# Patient Record
Sex: Male | Born: 1985 | Race: Black or African American | Marital: Married | Smoking: Former smoker
Health system: Southern US, Community
[De-identification: ages and names within clinical notes are randomized; demographics above are authoritative.]

## PROBLEM LIST (undated history)

## (undated) DIAGNOSIS — L709 Acne, unspecified: Secondary | ICD-10-CM

## (undated) HISTORY — DX: Acne, unspecified: L70.9

---

## 2011-08-16 ENCOUNTER — Ambulatory Visit (INDEPENDENT_AMBULATORY_CARE_PROVIDER_SITE_OTHER): Payer: 59 | Admitting: Family Medicine

## 2011-08-16 ENCOUNTER — Encounter: Payer: Self-pay | Admitting: Family Medicine

## 2011-08-16 VITALS — BP 122/74 | HR 72 | Ht 73.0 in | Wt 180.0 lb

## 2011-08-16 DIAGNOSIS — Z0000 Encounter for general adult medical examination without abnormal findings: Secondary | ICD-10-CM

## 2011-08-16 DIAGNOSIS — L708 Other acne: Secondary | ICD-10-CM

## 2011-08-16 DIAGNOSIS — L700 Acne vulgaris: Secondary | ICD-10-CM

## 2011-08-16 DIAGNOSIS — Z13220 Encounter for screening for lipoid disorders: Secondary | ICD-10-CM

## 2011-08-16 DIAGNOSIS — Z23 Encounter for immunization: Secondary | ICD-10-CM

## 2011-08-16 LAB — POCT URINALYSIS DIPSTICK
Blood, UA: NEGATIVE
Ketones, UA: NEGATIVE
Protein, UA: NEGATIVE
Spec Grav, UA: 1.015
Urobilinogen, UA: NEGATIVE

## 2011-08-16 NOTE — Progress Notes (Signed)
Scott Arroyo is a 26 y.o. male who presents for a complete physical.  He has the following concerns:  Trying to have a child for 5 months, and hasn't been successful.  His partner has a child from previous relationship.  He has no other children, but has always used condoms to prevent pregnancies in the past.   Health Maintenance: There is no immunization history on file for this patient. Last tetanus shot >10 years ago. Doesn't get flu shots. Last colonoscopy: never, n/a Last PSA: n/a Dentist: appt today (past due) Ophtho: last week, glasses ordered Exercise: 3-4 times/week, weights.  Plays basketball.  History reviewed. No pertinent past medical history.  History reviewed. No pertinent past surgical history.  History   Social History  . Marital Status: Single    Spouse Name: N/A    Number of Children: N/A  . Years of Education: N/A   Occupational History  . 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 Smoking status: Current Some Day Smoker -- .5 years    Types: Cigars  . Smokeless tobacco: Not on file   Comment: smokes 2 cigarettes/week  . Alcohol Use: Not on file     0-1 drinks per week.  . Drug Use: No  . Sexually Active: Not on file   Other Topics Concern  . Not on file   Social History Narrative   Engaged to be married.  Lives with fiancee and her daughter, 2 dogs    Family History  Problem Relation Age of Onset  . Diabetes Paternal Uncle   . Cancer Neg Hx   . Heart disease Neg Hx   . Stroke Neg Hx     No current outpatient prescriptions on file.  No Known Allergies  ROS:  The patient denies anorexia, fever, weight changes, headaches,  vision loss, decreased hearing, ear pain, hoarseness, chest pain, palpitations, dizziness, syncope, dyspnea on exertion, cough, swelling, nausea, vomiting, diarrhea, constipation, abdominal pain, melena, hematochezia, indigestion/heartburn, hematuria, incontinence, erectile dysfunction, nocturia,  weakened urine stream, dysuria, genital lesions, numbness, tingling, weakness, tremor, suspicious skin lesions, depression, anxiety, abnormal bleeding/bruising, or enlarged lymph nodes  +L knee pain--injured in past with basketball.  Recently flaring since he started exercising again.  PHYSICAL EXAM: BP 122/74  Pulse 72  Ht 6\' 1"  (1.854 m)  Wt 180 lb (81.647 kg)  BMI 23.75 kg/m2  General Appearance:    Alert, cooperative, no distress, appears stated age  Head:    Normocephalic, without obvious abnormality, atraumatic  Eyes:    PERRL, conjunctiva/corneas clear, EOM's intact, fundi    benign  Ears:    Normal TM's and external ear canals  Nose:   Nares normal, mucosa normal, no drainage or sinus   tenderness  Throat:   Lips, mucosa, and tongue normal; teeth and gums normal  Neck:   Supple, no lymphadenopathy;  thyroid:  no   enlargement/tenderness/nodules; no carotid   bruit or JVD  Back:    Spine nontender, no curvature, ROM normal, no CVA     tenderness  Lungs:     Clear to auscultation bilaterally without wheezes, rales or     ronchi; respirations unlabored  Chest Wall:    No tenderness or deformity   Heart:    Regular rate and rhythm, S1 and S2 normal, no murmur, rub   or gallop  Breast Exam:    No chest wall tenderness, masses or gynecomastia  Abdomen:     Soft, non-tender, nondistended,  normoactive bowel sounds,    no masses, no hepatosplenomegaly  Genitalia:    Normal male external genitalia without lesions.  Testicles without masses.  No inguinal hernias.  Rectal:   Deferred due to age <40 and lack of symptoms  Extremities:   No clubbing, cyanosis or edema. Left knee with slight effusion, warmth.  FROM  Pulses:   2+ and symmetric all extremities  Skin:   Skin color, texture, turgor normal, no rashes or lesions. Some pustular acne on forehead  Lymph nodes:   Cervical, supraclavicular, and axillary nodes normal  Neurologic:   CNII-XII intact, normal strength, sensation and gait;  reflexes 2+ and symmetric throughout          Psych:   Normal mood, affect, hygiene and grooming.    ASSESSMENT/PLAN: 1. Routine general medical examination at a health care facility  POCT Urinalysis Dipstick, HIV Antibody  2. Need for Tdap vaccination  Tdap vaccine greater than or equal to 7yo IM  3. Screening for lipoid disorders  Lipid panel  4. Acne vulgaris     L knee pain--some effusion.  Trial of aleve or ibuprofen for a week, icing.  F/u with ortho if ongoing pain. Acne--OTC trial of benzoyl peroxide or salicylic acid  Doesn't truly meet criteria for infertility yet--has only been 5 months.  Discussed having partner determine whether or not she is ovulating (basal body temps, or ovulation predictor kit).  If ongoing difficulties with conceiving, will at some point need evaluation of sperm for count and motility, but still early.  Reassured  Recommended at least 30 minutes of aerobic activity at least 5 days/week; proper sunscreen use reviewed; healthy diet and alcohol recommendations (less than or equal to 2 drinks/day) reviewed; regular seatbelt use; changing batteries in smoke detectors. Self-testicular exams. Immunization recommendations discussed--Tdap given.  Colonoscopy recommendations reviewed--age 80

## 2011-08-16 NOTE — Patient Instructions (Addendum)
HEALTH MAINTENANCE RECOMMENDATIONS:  It is recommended that you get at least 30 minutes of aerobic exercise at least 5 days/week (for weight loss, you may need as much as 60-90 minutes). This can be any activity that gets your heart rate up. This can be divided in 10-15 minute intervals if needed, but try and build up your endurance at least once a week.  Weight bearing exercise is also recommended twice weekly.  Eat a healthy diet with lots of vegetables, fruits and fiber.  "Colorful" foods have a lot of vitamins (ie green vegetables, tomatoes, red peppers, etc).  Limit sweet tea, regular sodas and alcoholic beverages, all of which has a lot of calories and sugar.  Up to 2 alcoholic drinks daily may be beneficial for men (unless trying to lose weight, watch sugars).  Drink a lot of water.  Sunscreen of at least SPF 30 should be used on all sun-exposed parts of the skin when outside between the hours of 10 am and 4 pm (not just when at beach or pool, but even with exercise, golf, tennis, and yard work!)  Use a sunscreen that says "broad spectrum" so it covers both UVA and UVB rays, and make sure to reapply every 1-2 hours.  Remember to change the batteries in your smoke detectors when changing your clock times in the spring and fall.  Use your seat belt every time you are in a car, and please drive safely and not be distracted with cell phones and texting while driving.  Self testicular exams once a month.  I recommend that your partner be checking to see if she is ovulating regularly (ie basal body temperatures, or ovulation predictor kits).  If she is ovulating regularly, and you are timing your intercourse correctly with ovulation, and you have been unable to get pregnant after a year of trying, then you both can consider consulting a fertility doctor (I would start with her regular OB-GYN), who will definitely recommend collecting sperm samples at that time.    Left knee pain--ice it when  swollen, and after exercise. Try taking Aleve twice daily for a week, and see if pain/swelling improves (OR ibuprofen, 3 tablets 3-4 times daily).  Take meds with food.  Over-the-counter acne medications containing either salicylic acid or benzoyl peroxide may help with acne.  Consider trying ProActive.

## 2011-08-17 ENCOUNTER — Other Ambulatory Visit: Payer: 59

## 2011-08-17 DIAGNOSIS — Z0000 Encounter for general adult medical examination without abnormal findings: Secondary | ICD-10-CM

## 2011-08-17 DIAGNOSIS — Z13220 Encounter for screening for lipoid disorders: Secondary | ICD-10-CM

## 2011-08-17 LAB — LIPID PANEL
Cholesterol: 162 mg/dL (ref 0–200)
LDL Cholesterol: 104 mg/dL — ABNORMAL HIGH (ref 0–99)
Total CHOL/HDL Ratio: 3.2 Ratio
VLDL: 8 mg/dL (ref 0–40)

## 2011-08-18 ENCOUNTER — Encounter: Payer: Self-pay | Admitting: Family Medicine

## 2013-09-18 ENCOUNTER — Encounter: Payer: Self-pay | Admitting: Family Medicine

## 2013-09-18 ENCOUNTER — Ambulatory Visit (INDEPENDENT_AMBULATORY_CARE_PROVIDER_SITE_OTHER): Payer: BC Managed Care – HMO | Admitting: Family Medicine

## 2013-09-18 VITALS — BP 118/72 | HR 72 | Ht 74.0 in | Wt 172.0 lb

## 2013-09-18 DIAGNOSIS — M25562 Pain in left knee: Principal | ICD-10-CM

## 2013-09-18 DIAGNOSIS — M25569 Pain in unspecified knee: Secondary | ICD-10-CM

## 2013-09-18 DIAGNOSIS — M25561 Pain in right knee: Secondary | ICD-10-CM

## 2013-09-18 NOTE — Progress Notes (Signed)
Chief Complaint  Patient presents with  . Knee Pain    B/L knee pain, left worse than right x 5 months. Plays basketball a lot and exercises. After tournament 08/23/13 lefy knee was swollen and really painful.     He has had pain off and on in his knees when he plays sport/exercises. It hurts while he is playing basketball, but doesn't persist long after. Knees don't usually swell, doesn't need to take any medication.  However, after a basketball tournament on 3/28, playing 5 games, his left knee was swollen behind the knee.  He took 2 advil every 6-8 hours for about 2-3 days, then pain /swelling resolved.  He presents today to have knee pain evaluated, since it hurts every time he plays basketball, or working out with his wife (jumping rope, jogging, squats). He admits to only stretching his quads, no other stretching.   History reviewed. No pertinent past medical history. History reviewed. No pertinent past surgical history. History   Social History  . Marital Status: Married    Spouse Name: N/A    Number of Children: N/A  . Years of Education: N/A   Occupational History  . machine operator    Social History Main Topics  . Smoking status: Current Some Day Smoker -- .5 years    Types: Cigars  . Smokeless tobacco: Not on file     Comment: vapor only  . Alcohol Use: Yes     Comment: 0-1 drinks per week.  . Drug Use: No  . Sexual Activity: Not on file   Other Topics Concern  . Not on file   Social History Narrative   Married, lives with wife and her daughter, 2 dogs   Family History  Problem Relation Age of Onset  . Diabetes Paternal Uncle   . Cancer Neg Hx   . Heart disease Neg Hx   . Stroke Neg Hx    No current outpatient prescriptions on file prior to visit.   No current facility-administered medications on file prior to visit.   No Known Allergies  ROS:  Denies fevers, weight changes, URI symptoms, headaches, dizziness, chest pain, GI or GU complaints, bleeding,  bruising, rash, other joint pains or other concerns.  PHYSICAL EXAM: BP 118/72  Pulse 72  Ht 6\' 2"  (1.88 m)  Wt 172 lb (78.019 kg)  BMI 22.07 kg/m2  Well developed, pleasant male in no distress Knees:  No effusion, warmth, swelling or crepitus. No joint line tenderness, or any tenderness.  FROM.  Negative Lachman, McMurray, flick test, drawer.  No pain with varus/valgus stress.  No pain with patellar compression, no patellar maltracking.  Good VMO muscle. Hamstrings are a little tight, unable to touch toes.  ASSESSMENT/PLAN:  Knee pain, bilateral  No evidence of internal derangement, arthritis, tendonitis, bursitis, or other abnormality--completely normal exam.  Taught proper stretching.  Ice prn if pain after activity, NSAID prn for any swelling.  F/u if any locking, giving way, recurrent swelling or other problems.

## 2013-09-18 NOTE — Patient Instructions (Signed)
Please do the stretches as shown after any exercise. Return if you have ongoing pain and swelling, or new problems develop.

## 2014-01-19 ENCOUNTER — Other Ambulatory Visit: Payer: Self-pay | Admitting: Family Medicine

## 2014-01-19 ENCOUNTER — Ambulatory Visit (INDEPENDENT_AMBULATORY_CARE_PROVIDER_SITE_OTHER): Payer: BC Managed Care – HMO | Admitting: Family Medicine

## 2014-01-19 ENCOUNTER — Encounter: Payer: Self-pay | Admitting: Family Medicine

## 2014-01-19 VITALS — BP 118/72 | HR 84 | Ht 73.0 in | Wt 169.0 lb

## 2014-01-19 DIAGNOSIS — R0989 Other specified symptoms and signs involving the circulatory and respiratory systems: Secondary | ICD-10-CM

## 2014-01-19 DIAGNOSIS — R002 Palpitations: Secondary | ICD-10-CM

## 2014-01-19 DIAGNOSIS — Z0000 Encounter for general adult medical examination without abnormal findings: Secondary | ICD-10-CM

## 2014-01-19 DIAGNOSIS — Z23 Encounter for immunization: Secondary | ICD-10-CM

## 2014-01-19 DIAGNOSIS — R0683 Snoring: Secondary | ICD-10-CM

## 2014-01-19 DIAGNOSIS — R0609 Other forms of dyspnea: Secondary | ICD-10-CM

## 2014-01-19 DIAGNOSIS — Z3169 Encounter for other general counseling and advice on procreation: Secondary | ICD-10-CM

## 2014-01-19 LAB — COMPREHENSIVE METABOLIC PANEL
ALBUMIN: 4.8 g/dL (ref 3.5–5.2)
ALT: 69 U/L — AB (ref 0–53)
AST: 73 U/L — AB (ref 0–37)
Alkaline Phosphatase: 51 U/L (ref 39–117)
BUN: 16 mg/dL (ref 6–23)
CALCIUM: 9.7 mg/dL (ref 8.4–10.5)
CHLORIDE: 101 meq/L (ref 96–112)
CO2: 28 mEq/L (ref 19–32)
Creat: 0.99 mg/dL (ref 0.50–1.35)
Glucose, Bld: 83 mg/dL (ref 70–99)
POTASSIUM: 4 meq/L (ref 3.5–5.3)
SODIUM: 137 meq/L (ref 135–145)
TOTAL PROTEIN: 7.4 g/dL (ref 6.0–8.3)
Total Bilirubin: 1.1 mg/dL (ref 0.2–1.2)

## 2014-01-19 LAB — CBC WITH DIFFERENTIAL/PLATELET
BASOS ABS: 0 10*3/uL (ref 0.0–0.1)
BASOS PCT: 0 % (ref 0–1)
EOS ABS: 0.2 10*3/uL (ref 0.0–0.7)
Eosinophils Relative: 3 % (ref 0–5)
HCT: 37.4 % — ABNORMAL LOW (ref 39.0–52.0)
HEMOGLOBIN: 12.7 g/dL — AB (ref 13.0–17.0)
Lymphocytes Relative: 50 % — ABNORMAL HIGH (ref 12–46)
Lymphs Abs: 2.6 10*3/uL (ref 0.7–4.0)
MCH: 27.1 pg (ref 26.0–34.0)
MCHC: 34 g/dL (ref 30.0–36.0)
MCV: 79.9 fL (ref 78.0–100.0)
Monocytes Absolute: 0.5 10*3/uL (ref 0.1–1.0)
Monocytes Relative: 9 % (ref 3–12)
NEUTROS ABS: 1.9 10*3/uL (ref 1.7–7.7)
NEUTROS PCT: 38 % — AB (ref 43–77)
PLATELETS: 294 10*3/uL (ref 150–400)
RBC: 4.68 MIL/uL (ref 4.22–5.81)
RDW: 14.8 % (ref 11.5–15.5)
WBC: 5.1 10*3/uL (ref 4.0–10.5)

## 2014-01-19 LAB — POCT URINALYSIS DIPSTICK
BILIRUBIN UA: NEGATIVE
GLUCOSE UA: NEGATIVE
Ketones, UA: NEGATIVE
Leukocytes, UA: NEGATIVE
NITRITE UA: NEGATIVE
RBC UA: NEGATIVE
Spec Grav, UA: 1.02
Urobilinogen, UA: NEGATIVE
pH, UA: 5

## 2014-01-19 LAB — TSH: TSH: 1.471 u[IU]/mL (ref 0.350–4.500)

## 2014-01-19 NOTE — Patient Instructions (Addendum)
  HEALTH MAINTENANCE RECOMMENDATIONS:  It is recommended that you get at least 30 minutes of aerobic exercise at least 5 days/week (for weight loss, you may need as much as 60-90 minutes). This can be any activity that gets your heart rate up. This can be divided in 10-15 minute intervals if needed, but try and build up your endurance at least once a week.  Weight bearing exercise is also recommended twice weekly.  Eat a healthy diet with lots of vegetables, fruits and fiber.  "Colorful" foods have a lot of vitamins (ie green vegetables, tomatoes, red peppers, etc).  Limit sweet tea, regular sodas and alcoholic beverages, all of which has a lot of calories and sugar.  Up to 2 alcoholic drinks daily may be beneficial for men (unless trying to lose weight, watch sugars).  Drink a lot of water.  Sunscreen of at least SPF 30 should be used on all sun-exposed parts of the skin when outside between the hours of 10 am and 4 pm (not just when at beach or pool, but even with exercise, golf, tennis, and yard work!)  Use a sunscreen that says "broad spectrum" so it covers both UVA and UVB rays, and make sure to reapply every 1-2 hours.  Remember to change the batteries in your smoke detectors when changing your clock times in the spring and fall.  Use your seat belt every time you are in a car, and please drive safely and not be distracted with cell phones and texting while driving.  Please try and not use e-cigarettes.  While these may be safer than cigarettes (and safety is still unknown), there is potential harm.  The nicotine might also be contributing to your heart complaints (beating fast, irregular).  Do self-testicular exams once a month.  Infertility--at this point, I recommend that you and your wife make an appointment with her doctor for evaluation (since it has been >1 year of trying for pregnancy)--this evaluation will include sperm evaluation (to check on number, motility, etc).

## 2014-01-19 NOTE — Progress Notes (Signed)
Chief Complaint  Patient presents with  . Annual Exam    fasting annual exam. UA showed trace protein, no symptoms. No concerns today. Declined flu vaccine today.    Scott Arroyo is a 28 y.o. male who presents for a complete physical.  He has the following concerns:  He was last seen in April with bilateral knee pain.  He has been stretching regularly and taking vitamins, and no longer has any knee problems.  He states that he and his wife are still trying for pregnancy--he would like a son.  He has never fathered a child (per review of chart, over 2 years now).  He is asking about whether marijuana use could effect fertility--he denies any recent use.  Immunization History  Administered Date(s) Administered  . Tdap 08/16/2011   Last colonoscopy: never, n/a  Last PSA: n/a  Dentist: last year, goes once yearly, due Ophtho: every other year Exercise: 3-4 times/week, weights and treadmill for 10 mins at end of weights. Plays basketball 2x/week. Lab Results  Component Value Date   CHOL 162 08/17/2011   HDL 50 08/17/2011   LDLCALC 104* 08/17/2011   TRIG 38 08/17/2011   CHOLHDL 3.2 08/17/2011   History reviewed. No pertinent past medical history.  History reviewed. No pertinent past surgical history.  History   Social History  . Marital Status: Married    Spouse Name: N/A    Number of Children: N/A  . Years of Education: N/A   Occupational History  . machine operator    Social History Main Topics  . Smoking status: Current Some Day Smoker -- .5 years    Types: Cigars  . Smokeless tobacco: Not on file     Comment: vapor only  . Alcohol Use: Yes     Comment: 0-1 drinks per week.  . Drug Use: No  . Sexual Activity: Yes    Partners: Female   Other Topics Concern  . Not on file   Social History Narrative   Married, lives with wife and her daughter, 2 dogs    Family History  Problem Relation Age of Onset  . Diabetes Paternal Uncle   . Cancer Neg Hx   . Heart disease  Neg Hx   . Stroke Neg Hx     Current outpatient prescriptions:Multiple Vitamins-Minerals (MULTI-VITAMIN GUMMIES PO), Take 2 each by mouth daily., Disp: , Rfl:   No Known Allergies  ROS: The patient denies anorexia, fever, weight changes, headaches, vision loss, decreased hearing, ear pain, hoarseness, chest pain, palpitations, dizziness, syncope, dyspnea on exertion, cough, swelling, nausea, vomiting, diarrhea, constipation, abdominal pain, melena, hematochezia, indigestion/heartburn, hematuria, incontinence, erectile dysfunction, nocturia, weakened urine stream, dysuria, genital lesions, numbness, tingling, weakness, tremor, suspicious skin lesions, depression, anxiety, abnormal bleeding/bruising, or enlarged lymph nodes. Sometimes he notes irregular heartbeat, where it periodically speeds up.  Noted on ROS--going on for many years, unchanged.  Occurs at rest, notices some extra beats.  Short-lived, no associated SOB or chest pain. Wife tells him that he snores, doesn't mention apnea.  He feels refreshed in the morning, denies daytime somnolence or morning headaches. Denies joint pains (knee pain resolved) Lost 3# from last visit, down 11 from CPE 2 years ago  PHYSICAL EXAM:  BP 118/72  Pulse 84  Ht  (1.854 m)  Wt 169 lb (76.658 kg)  BMI 22.30 kg/m2   General Appearance:  Alert, cooperative, no distress, appears stated age   Head:  Normocephalic, without obvious abnormality, atraumatic   Eyes:  PERRL, conjunctiva/corneas clear, EOM's intact, fundi  benign   Ears:  Normal TM's and external ear canals   Nose:  Nares normal, mucosa normal, no drainage or sinus tenderness   Throat:  Lips, mucosa, and tongue normal; teeth and gums normal   Neck:  Supple, no lymphadenopathy; thyroid: no enlargement/tenderness/nodules; no carotid  bruit or JVD   Back:  Spine nontender, no curvature, ROM normal, no CVA tenderness   Lungs:  Clear to auscultation bilaterally without wheezes, rales or  ronchi; respirations unlabored   Chest Wall:  No tenderness or deformity   Heart:  Regular rate and rhythm, S1 and S2 normal, no murmur, rub  or gallop   Breast Exam:  No chest wall tenderness, masses or gynecomastia   Abdomen:  Soft, non-tender, nondistended, normoactive bowel sounds,  no masses, no hepatosplenomegaly   Genitalia:  Normal male external genitalia without lesions. Testicles without masses. No inguinal hernias.   Rectal:  Deferred due to age <40 and lack of symptoms   Extremities:  No clubbing, cyanosis or edema. Left knee with slight effusion, warmth. FROM   Pulses:  2+ and symmetric all extremities   Skin:  Skin color, texture, turgor normal, no rashes or lesions. Some pustular acne on forehead. Acne scars (mild) on back, no active lesions.  +tattoos  Lymph nodes:  Cervical, supraclavicular, and axillary nodes normal   Neurologic:  CNII-XII intact, normal strength, sensation and gait; reflexes 2+ and symmetric throughout             Psych: Normal mood, affect, hygiene and grooming.   ASSESSMENT/PLAN:  Routine general medical examination at a health care facility - Plan: POCT Urinalysis Dipstick  Palpitations - Plan: Comprehensive metabolic panel, CBC with Differential, TSH  Need for prophylactic vaccination and inoculation against influenza - Plan: Flu Vaccine QUAD 36+ mos PF IM (Fluarix Quad PF)  Snoring - ask wife re: apnea.  Sounds as though he has mild snoring only when on his back. no symptoms of OSA  Infertility counseling   Palpitations--discussed potential triggers including nicotine (from e-cig) and caffeine. Encouraged to quit use of e-cigs.  Return for re-evaluation if increasing frequency, associated dizziness, chest pain or shortness of breath.  Check labs  Snoring--ask wife re: apnea.  No symptoms to suggest sleep apnea.  Recommended at least 30 minutes of aerobic activity at least 5 days/week; proper sunscreen use reviewed; healthy diet and alcohol  recommendations (less than or equal to 2 drinks/day) reviewed; regular seatbelt use; changing batteries in smoke detectors. Self-testicular exams. Immunization recommendations discussed--flu shot today. Colonoscopy recommendations reviewed--age 71       Infertility--at this point, recommended that wife make appointment with her GYN, and he will have semen eval as part of the workup.  Flu shot given

## 2014-01-21 ENCOUNTER — Other Ambulatory Visit: Payer: Self-pay | Admitting: *Deleted

## 2014-01-21 DIAGNOSIS — R7989 Other specified abnormal findings of blood chemistry: Secondary | ICD-10-CM

## 2014-01-21 DIAGNOSIS — R945 Abnormal results of liver function studies: Secondary | ICD-10-CM

## 2014-01-21 LAB — HEPATITIS C ANTIBODY: HCV Ab: NEGATIVE

## 2014-01-21 LAB — IRON: IRON: 100 ug/dL (ref 42–165)

## 2014-01-21 LAB — FERRITIN: Ferritin: 157 ng/mL (ref 22–322)

## 2014-01-21 LAB — HEPATITIS B SURFACE ANTIGEN: Hepatitis B Surface Ag: NEGATIVE

## 2014-02-23 ENCOUNTER — Other Ambulatory Visit: Payer: BC Managed Care – HMO

## 2014-02-23 DIAGNOSIS — R7989 Other specified abnormal findings of blood chemistry: Secondary | ICD-10-CM

## 2014-02-23 DIAGNOSIS — R945 Abnormal results of liver function studies: Secondary | ICD-10-CM

## 2014-02-23 LAB — HEPATIC FUNCTION PANEL
ALBUMIN: 4.5 g/dL (ref 3.5–5.2)
ALK PHOS: 53 U/L (ref 39–117)
ALT: 12 U/L (ref 0–53)
AST: 17 U/L (ref 0–37)
BILIRUBIN TOTAL: 0.7 mg/dL (ref 0.2–1.2)
Bilirubin, Direct: 0.2 mg/dL (ref 0.0–0.3)
Indirect Bilirubin: 0.5 mg/dL (ref 0.2–1.2)
Total Protein: 6.9 g/dL (ref 6.0–8.3)

## 2014-10-14 ENCOUNTER — Telehealth: Payer: Self-pay | Admitting: Internal Medicine

## 2014-10-14 NOTE — Telephone Encounter (Signed)
Faxed over medical records to [facility] on 10/13/14 @ [phone]

## 2014-12-08 ENCOUNTER — Ambulatory Visit (INDEPENDENT_AMBULATORY_CARE_PROVIDER_SITE_OTHER): Payer: BLUE CROSS/BLUE SHIELD | Admitting: Medical

## 2014-12-08 ENCOUNTER — Encounter: Payer: Self-pay | Admitting: Medical

## 2014-12-08 VITALS — BP 120/70 | HR 69 | Resp 16 | Wt 175.0 lb

## 2014-12-08 DIAGNOSIS — L237 Allergic contact dermatitis due to plants, except food: Secondary | ICD-10-CM | POA: Diagnosis not present

## 2014-12-08 MED ORDER — TRIAMCINOLONE ACETONIDE 0.1 % EX CREA: 1.0000 "application " | TOPICAL_CREAM | Freq: Two times a day (BID) | CUTANEOUS | Status: DC

## 2014-12-08 MED ORDER — TRIAMCINOLONE ACETONIDE 0.1 % EX CREA
1.0000 | TOPICAL_CREAM | Freq: Two times a day (BID) | CUTANEOUS | Status: DC
Start: 2014-12-08 — End: 2015-08-05

## 2014-12-08 MED ORDER — METHYLPREDNISOLONE ACETATE PF 80 MG/ML IJ SUSP
80.0000 mg | Freq: Once | INTRAMUSCULAR | Status: AC
  Administered 2014-12-08: 80 mg via INTRAMUSCULAR

## 2014-12-08 NOTE — Progress Notes (Signed)
Subjective:   Scott Arroyo is a 29 y.o. male who presents for evaluation of a rash involving the arms and abdomen.  Concerned for poison ivy as they have recent exposure - was helping clear some brush for family member over July 4th weekend with short sleeve shirt, otherwise was covered up.  Rash is itchy, red, raised.  Patient denies: fever, joint aches, chills, nausea. Patient has had similar reaction in the past.  Using OTC cream for the rash.  No other aggravating or relieving factors.  No other c/o.   The following portions of the patient's history were reviewed and updated as appropriate: allergies, current medications, past family history, past medical history, past social history and problem list.  Review of Systems As in subjective above   Objective:   Gen: wd, wn, nad Skin:  Several patches of pink/red, linear and round urticarial lesions involving bilat forearms and lower abdomen   Assessment:   Encounter Diagnosis  Name Primary?  . Poison oak dermatitis Yes      Plan:   Discussed symptoms and exam findings, diagnosis, treatment options.  Etiology appears to be poison oak dermatitis.  80 mg Depo-Medrol given IM in office.   Prescribed: Triamcinolone steroid cream to apply topically daily for the next several days until this resolves.  Discussed washing contaminated clothing on the hot cycle in the washing machine, washing gloves, shoes, or other utensils in hot soapy water.  Avoid re- exposure.  Discussed signs of infection or worsening symptoms that would prompt recheck.   Advised OTC Benadryl 1/2-1 tablet po up to TID.    Follow up prn, or if not much improved in the next 4-5 days.

## 2014-12-08 NOTE — Patient Instructions (Signed)
Poison Oak Recommendations:  We gave you a shot of steroid/Depo Medrol today in the office to calm down the rash and irritation  You may use the Triamcinolone cream topically to the arms and abdomen (not the face or genitals though) twice daily for the next week or 2 until resolved  Use OTC Benadryl 25mg  tablet at bedtime and possibly 1/2-1 tablet during the day  If not much improved by Friday, let me know  Poison oak is an inflammation of the skin (contact dermatitis). It is caused by contact with the allergens on the leaves of the oak (toxicodendron) plants. Depending on your sensitivity, the rash may consist simply of redness and itching, or it may also progress to blisters which may break open (rupture). These must be well cared for to prevent secondary germ (bacterial) infection as these infections can lead to scarring. The eyes may also get puffy. The puffiness is worst in the morning and gets better as the day progresses. Healing is best accomplished by keeping any open areas dry, clean, covered with a bandage, and covered with an antibacterial ointment if needed. Without secondary infection, this dermatitis usually heals without scarring within 2 to 3 weeks without treatment. HOME CARE INSTRUCTIONS When you have been exposed to poison oak, it is very important to thoroughly wash with soap and water as soon as the exposure has been discovered. You have about one half hour to remove the plant resin before it will cause the rash. This cleaning will quickly destroy the oil or antigen on the skin (the antigen is what causes the rash). Wash aggressively under the fingernails as any plant resin still there will continue to spread the rash. Do not rub skin vigorously when washing affected area. Poison oak cannot spread if no oil from the plant remains on your body. Rash that has progressed to weeping sores (lesions) will not spread the rash unless you have not washed thoroughly. It is also important to  clean any clothes you have been wearing as they may carry active allergens which will spread the rash, even several days later. Avoidance of the plant in the future is the best measure. Poison oak plants can be recognized by the number of leaves. Generally, poison oak has three leaves with flowering branches on a single stem. Diphenhydramine may be purchased over the counter and used as needed for itching. Do not drive with this medication if it makes you drowsy. Ask your caregiver about medication for children. SEEK IMMEDIATE MEDICAL CARE IF:   Open areas of the rash develop.   You notice redness extending beyond the area of the rash.   There is a pus like discharge.   There is increased pain.   Other signs of infection develop (such as fever).  Document Released: 11/19/2002 Document Revised: 01/25/2011 Document Reviewed: 03/31/2009 [facility] Patient Information 2012 [place], [place].

## 2014-12-08 NOTE — Addendum Note (Signed)
Addended by: Janeice RobinsonSCALES, Jareb Radoncic L on: 12/08/2014 08:55 AM   Modules accepted: Orders

## 2014-12-08 NOTE — Addendum Note (Signed)
Addended by: Herminio CommonsJOHNSON, Latayvia Mandujano A on: 12/08/2014 10:55 AM   Modules accepted: Orders

## 2015-08-05 ENCOUNTER — Ambulatory Visit (INDEPENDENT_AMBULATORY_CARE_PROVIDER_SITE_OTHER): Payer: BLUE CROSS/BLUE SHIELD | Admitting: Family Medicine

## 2015-08-05 ENCOUNTER — Encounter: Payer: Self-pay | Admitting: Family Medicine

## 2015-08-05 VITALS — BP 110/74 | HR 56 | Ht 73.0 in | Wt 171.6 lb

## 2015-08-05 DIAGNOSIS — Z0000 Encounter for general adult medical examination without abnormal findings: Secondary | ICD-10-CM

## 2015-08-05 DIAGNOSIS — R5383 Other fatigue: Secondary | ICD-10-CM | POA: Diagnosis not present

## 2015-08-05 DIAGNOSIS — R945 Abnormal results of liver function studies: Secondary | ICD-10-CM

## 2015-08-05 DIAGNOSIS — Z862 Personal history of diseases of the blood and blood-forming organs and certain disorders involving the immune mechanism: Secondary | ICD-10-CM | POA: Diagnosis not present

## 2015-08-05 DIAGNOSIS — M222X2 Patellofemoral disorders, left knee: Secondary | ICD-10-CM

## 2015-08-05 DIAGNOSIS — R7989 Other specified abnormal findings of blood chemistry: Secondary | ICD-10-CM

## 2015-08-05 LAB — COMPREHENSIVE METABOLIC PANEL
ALT: 21 U/L (ref 9–46)
AST: 20 U/L (ref 10–40)
Albumin: 4.3 g/dL (ref 3.6–5.1)
Alkaline Phosphatase: 42 U/L (ref 40–115)
BILIRUBIN TOTAL: 0.8 mg/dL (ref 0.2–1.2)
BUN: 14 mg/dL (ref 7–25)
CHLORIDE: 104 mmol/L (ref 98–110)
CO2: 26 mmol/L (ref 20–31)
CREATININE: 1.1 mg/dL (ref 0.60–1.35)
Calcium: 9.4 mg/dL (ref 8.6–10.3)
GLUCOSE: 81 mg/dL (ref 65–99)
Potassium: 4.2 mmol/L (ref 3.5–5.3)
SODIUM: 139 mmol/L (ref 135–146)
Total Protein: 7.2 g/dL (ref 6.1–8.1)

## 2015-08-05 LAB — CBC WITH DIFFERENTIAL/PLATELET
BASOS ABS: 0.1 10*3/uL (ref 0.0–0.1)
Basophils Relative: 1 % (ref 0–1)
EOS PCT: 6 % — AB (ref 0–5)
Eosinophils Absolute: 0.3 10*3/uL (ref 0.0–0.7)
HEMATOCRIT: 41.1 % (ref 39.0–52.0)
Hemoglobin: 13.4 g/dL (ref 13.0–17.0)
LYMPHS ABS: 2.3 10*3/uL (ref 0.7–4.0)
LYMPHS PCT: 45 % (ref 12–46)
MCH: 26.9 pg (ref 26.0–34.0)
MCHC: 32.6 g/dL (ref 30.0–36.0)
MCV: 82.5 fL (ref 78.0–100.0)
MONO ABS: 0.4 10*3/uL (ref 0.1–1.0)
MONOS PCT: 8 % (ref 3–12)
MPV: 10.5 fL (ref 8.6–12.4)
Neutro Abs: 2 10*3/uL (ref 1.7–7.7)
Neutrophils Relative %: 40 % — ABNORMAL LOW (ref 43–77)
Platelets: 310 10*3/uL (ref 150–400)
RBC: 4.98 MIL/uL (ref 4.22–5.81)
RDW: 14.2 % (ref 11.5–15.5)
WBC: 5 10*3/uL (ref 4.0–10.5)

## 2015-08-05 LAB — POCT URINALYSIS DIPSTICK
Bilirubin, UA: NEGATIVE
Glucose, UA: NEGATIVE
Ketones, UA: NEGATIVE
Leukocytes, UA: NEGATIVE
NITRITE UA: NEGATIVE
PROTEIN UA: NEGATIVE
RBC UA: NEGATIVE
UROBILINOGEN UA: 0.2
pH, UA: 6

## 2015-08-05 NOTE — Patient Instructions (Addendum)
HEALTH MAINTENANCE RECOMMENDATIONS:  It is recommended that you get at least 30 minutes of aerobic exercise at least 5 days/week (for weight loss, you may need as much as 60-90 minutes). This can be any activity that gets your heart rate up. This can be divided in 10-15 minute intervals if needed, but try and build up your endurance at least once a week.  Weight bearing exercise is also recommended twice weekly.  Eat a healthy diet with lots of vegetables, fruits and fiber.  "Colorful" foods have a lot of vitamins (ie green vegetables, tomatoes, red peppers, etc).  Limit sweet tea, regular sodas and alcoholic beverages, all of which has a lot of calories and sugar.  Up to 2 alcoholic drinks daily may be beneficial for men (unless trying to lose weight, watch sugars).  Drink a lot of water.  Sunscreen of at least SPF 30 should be used on all sun-exposed parts of the skin when outside between the hours of 10 am and 4 pm (not just when at beach or pool, but even with exercise, golf, tennis, and yard work!)  Use a sunscreen that says "broad spectrum" so it covers both UVA and UVB rays, and make sure to reapply every 1-2 hours.  Remember to change the batteries in your smoke detectors when changing your clock times in the spring and fall.  Use your seat belt every time you are in a car, and please drive safely and not be distracted with cell phones and texting while driving.  Feeling unrefreshed in the mornings is likely due to you getting inadequate sleep.  Try and get at least 7-9 hours of sleep each night. If you wife starts to report that you take long pauses in your breathing, please let us know, and we can order a sleep study to look for sleep apnea.  (loud snoring without long pauses is okay).  Patellofemoral Pain Syndrome Patellofemoral pain syndrome is a condition that involves a softening or breakdown of the tissue (cartilage) on the underside of your kneecap (patella). This causes pain in the  front of the knee. The condition is also called runner's knee or chondromalacia patella. Patellofemoral pain syndrome is most common in young adults who are active in sports. Your knee is the largest joint in your body. The patella covers the front of your knee and is attached to muscles above and below your knee. The underside of the patella is covered with a smooth type of cartilage (synovium). The smooth surface helps the patella glide easily when you move your knee. Patellofemoral pain syndrome causes swelling in the joint linings and bone surfaces in your knee.  CAUSES  Patellofemoral pain syndrome can be caused by:  Overuse.  Poor alignment of your knee joints.  Weak leg muscles.  A direct blow to your kneecap. RISK FACTORS You may be at risk for patellofemoral pain syndrome if you:  Do a lot of activities that can wear down your kneecap. These include:  Running.  Squatting.  Climbing stairs.  Start a new physical activity or exercise program.  Wear shoes that do not fit well.  Do not have good leg strength.  Are overweight. SIGNS AND SYMPTOMS  Knee pain is the most common symptom of patellofemoral pain syndrome. This may feel like a dull, aching pain underneath your patella, in the front of your knee. There may be a popping or cracking sound when you move your knee. Pain may get worse with:  Exercise.  Climbing stairs.  Running.  Jumping.  Squatting.  Kneeling.  Sitting for a long time.  Moving or pushing on your patella. DIAGNOSIS  Your health care provider may be able to diagnose patellofemoral pain syndrome from your symptoms and medical history. You may be asked about your recent physical activities and which ones cause knee pain. Your health care provider may do a physical exam with certain tests to confirm the diagnosis. These may include:  Moving your patella back and forth.  Checking your range of knee motion.  Having you squat or jump to see if  you have pain.  Checking the strength of your leg muscles. An MRI of the knee may also be done. TREATMENT  Patellofemoral pain syndrome can usually be treated at home with rest, ice, compression, and elevation (RICE). Other treatments may include:  Nonsteroidal anti-inflammatory drugs (NSAIDs).  Physical therapy to stretch and strengthen your leg muscles.  Shoe inserts (orthotics) to take stress off your knee.  A knee brace or knee support.  Surgery to remove damaged cartilage or move the patella to a better position. The need for surgery is rare. HOME CARE INSTRUCTIONS   Take medicines only as directed by your health care provider.  Rest your knee.  When resting, keep your knee raised above the level of your heart.  Avoid activities that cause knee pain.  Apply ice to the injured area:  Put ice in a plastic bag.  Place a towel between your skin and the bag.  Leave the ice on for 20 minutes, 2-3 times a day.  Use splints, braces, knee supports, or walking aids as directed by your health care provider.  Perform stretching and strengthening exercises as directed by your health care provider or physical therapist.  Keep all follow-up visits as directed by your health care provider. This is important. SEEK MEDICAL CARE IF:   Your symptoms get worse.  You are not improving with home care. MAKE SURE YOU:  Understand these instructions.  Will watch your condition.  Will get help right away if you are not doing well or get worse.   This information is not intended to replace advice given to you by your health care provider. Make sure you discuss any questions you have with your health care provider.   Document Released: 05/03/2009 Document Revised: 06/05/2014 Document Reviewed: 08/04/2013 Elsevier Interactive Patient Education [employer]

## 2015-08-05 NOTE — Progress Notes (Signed)
Chief Complaint  Patient presents with  . Annual Exam    fasting annual exam. Did not do eye exam as he just saw Dr.Mcfarland and got new glasses. Only concern is that left knee is still bothering him.   . Flu Vaccine    declined.   Scott Arroyo is a 30 y.o. male who presents for a complete physical.  He has the following concerns:  H/o bilateral knee pain (seen in 08/2013 for this). Pain had resolved with stretches.  At the end of 2016 he started having recurrent pain in L knee, after any exercise or playing basketball.  Pain is on both side of the knee, and behind the kneecap.  He sometimes feels popping in the knee.  Sometimes it feels weak, when going up the stairs, painful, but no giving way or falls.  No locking of the knee.  It sometimes swells after playing ball all day in a tournament. He wears a brace while playing, and denies pain with playing basketball.  He uses icy hot with some relief.  Sometimes uses a tylenol as needed for pain. He stretches some before exercise, and only sometimes afterwards.  He states that he and his wife are still trying for pregnancy.  They been seeing fertility specialist, she is taking injections.  His sperm counts were reportedly normal.  Immunization History  Administered Date(s) Administered  . Influenza,inj,Quad PF,36+ Mos 01/19/2014  . Tdap 08/16/2011   Last colonoscopy: never, n/a  Last PSA: n/a  Dentist: once a year Ophtho: every other year; wears glasses with driving Exercise: 3-4 times/week, weights and treadmill for 10 mins at end of weights. Plays basketball 2x/week.  Lipids Lab Results  Component Value Date   CHOL 162 08/17/2011   HDL 50 08/17/2011   LDLCALC 104* 08/17/2011   TRIG 38 08/17/2011   CHOLHDL 3.2 08/17/2011   Past Medical History  Diagnosis Date  . Acne     dermatologist at [facility]    History reviewed. No pertinent past surgical history.  Social History   Social History  . Marital Status: Married    Spouse  Name: N/A  . Number of Children: N/A  . Years of Education: N/A   Occupational History  . machine operator    Social History Main Topics  . Smoking status: Current Some Day Smoker -- .5 years    Types: Cigars  . Smokeless tobacco: Not on file     Comment: vapor only; previously smoked cigars  . Alcohol Use: 0.0 oz/week    0 Standard drinks or equivalent per week     Comment: 0-1 drinks per week.  . Drug Use: No  . Sexual Activity:    Partners: Female   Other Topics Concern  . Not on file   Social History Narrative   Married, lives with wife and her daughter, 2 dogs    Family History  Problem Relation Age of Onset  . Diabetes Paternal Uncle   . Cancer Neg Hx   . Heart disease Neg Hx   . Stroke Neg Hx   . Hypertension Mother     Outpatient Encounter Prescriptions as of 08/05/2015  Medication Sig Note  . clobetasol (TEMOVATE) 0.05 % external solution  08/05/2015: Received from: [facility]  . ketoconazole (NIZORAL) 2 % shampoo APPLY TO SCALP ONCE EVERY 1-2 WEEKS. ALLOW TO SIT 10 MINUTES THEN RINSE. 08/05/2015: Received from: External Pharmacy  . Multiple Vitamins-Minerals (MULTI-VITAMIN GUMMIES PO) Take 2 each by mouth daily.   Marland Kitchen  clindamycin (CLINDAGEL) 1 % gel Reported on 08/05/2015 08/05/2015: Received from: [facility]  . doxycycline (VIBRAMYCIN) 100 MG capsule Take 100 mg by mouth. Reported on 08/05/2015 08/05/2015: Received from: [facility]  . tretinoin (RETIN-A) 0.025 % cream Reported on 08/05/2015 08/05/2015: Received from: [facility]  . [DISCONTINUED] triamcinolone cream (KENALOG) 0.1 % Apply 1 application topically 2 (two) times daily.    No facility-administered encounter medications on file as of 08/05/2015.   No Known Allergies  ROS: The patient denies anorexia, fever, weight changes, headaches, vision loss, decreased hearing, ear pain, hoarseness, chest pain, palpitations, dizziness,  syncope, dyspnea on exertion, cough, swelling, nausea, vomiting, diarrhea, constipation, abdominal pain, melena, hematochezia, indigestion/heartburn, hematuria, incontinence, erectile dysfunction, nocturia, weakened urine stream, dysuria, genital lesions, numbness, tingling, weakness, tremor, suspicious skin lesions, depression, anxiety, abnormal bleeding/bruising, or enlarged lymph nodes. Sometimes he notes irregular heartbeat, where it periodically speeds up. Noted on ROS--going on for many years, unchanged. Occurs at rest, notices some extra beats. Short-lived, no associated SOB or chest pain. Occurs about 1x/week, and hasn't changed in frequency/severity in many years. Wife tells him that he snores, doesn't mention apnea. He feels somewhat unrefreshed in the morning, but admits to getting only 5-6 hours of sleep each night. Denies daytime somnolence or morning headaches. +left knee pain as per HPI +acne on face and back (saw dermatologist; face is better, still issues with back, but admits to noncompliance with the doxy).   PHYSICAL EXAM:  BP 110/74 mmHg  Pulse 56  Ht '6\' 1"'  (1.854 m)  Wt 171 lb 9.6 oz (77.837 kg)  BMI 22.64 kg/m2   General Appearance:  Alert, cooperative, no distress, appears stated age   Head:  Normocephalic, without obvious abnormality, atraumatic   Eyes:  PERRL, conjunctiva/corneas clear, EOM's intact, fundi  benign   Ears:  Normal TM's and external ear canals   Nose:  Nares normal, mucosa normal, no drainage or sinus tenderness   Throat:  Lips, mucosa, and tongue normal; teeth and gums normal   Neck:  Supple, no lymphadenopathy; thyroid: no enlargement/tenderness/nodules; no carotid  bruit or JVD   Back:  Spine nontender, no curvature, ROM normal, no CVA tenderness   Lungs:  Clear to auscultation bilaterally without wheezes, rales or ronchi; respirations unlabored   Chest Wall:  No tenderness or deformity. 2 resolving pimples/ingrown hairs  vs scars to the right of midline. No active inflammation, nontender.   Heart:  Regular rate and rhythm, S1 and S2 normal, no murmur, rub  or gallop   Breast Exam:  No chest wall tenderness, masses or gynecomastia   Abdomen:  Soft, non-tender, nondistended, normoactive bowel sounds,  no masses, no hepatosplenomegaly   Genitalia:  Normal male external genitalia without lesions. Testicles without masses. No inguinal hernias.   Rectal:  Deferred due to age <40 and lack of symptoms   Extremities:  No clubbing, cyanosis or edema. FROM, no effusion, warmth. Negative lachman, [place]. No joint line tenderness or pain with valgus/varus stress.  +clicking and lateral maltracking of patella with quadricep contraction  Pulses:  2+ and symmetric all extremities   Skin:  Skin color, texture, turgor normal, no rashes or lesions. Mild scarring/hyperpigmentation on forehead, no active acne lesions.  Acne scars (mild) on back, rare pustule. +tattoos  Lymph nodes:  Cervical, supraclavicular, and axillary nodes normal   Neurologic:  CNII-XII intact, normal strength, sensation and gait; reflexes 2+ and symmetric throughout  Psych: Normal mood, affect, hygiene and grooming.          ASSESSMENT/PLAN:  Annual physical exam - Plan: POCT Urinalysis Dipstick, Comprehensive metabolic panel, CBC with Differential/Platelet, VITAMIN D 25 Hydroxy (Vit-D Deficiency, Fractures)  Other fatigue - Plan: Comprehensive metabolic panel, CBC with Differential/Platelet, VITAMIN D 25 Hydroxy (Vit-D Deficiency, Fractures)  Elevated LFTs - Plan: Comprehensive metabolic panel  History of anemia - Plan: CBC with Differential/Platelet  Patellofemoral disorder of left knee  Recommended at least 30 minutes of aerobic activity at least 5 days/week; weight-bearing exercise at least 2-3x/wk; proper sunscreen use reviewed; healthy diet and alcohol recommendations (less than or equal  to 2 drinks/day) reviewed; regular seatbelt use; changing batteries in smoke detectors. Self-testicular exams. Immunization recommendations discussed--flu shots are recommended yearly, declines today.  Colonoscopy recommendations reviewed--age 77   Labs reviewed from 12/2013--Hg slightly low at 12.7. LFT's had been elevated, but normal on repeat a month later. He had normal HepB, HepC, ferritin and iron levels.  c-met and CBC, vitamin D today

## 2015-08-06 LAB — VITAMIN D 25 HYDROXY (VIT D DEFICIENCY, FRACTURES): Vit D, 25-Hydroxy: 25 ng/mL — ABNORMAL LOW (ref 30–100)

## 2015-08-12 ENCOUNTER — Other Ambulatory Visit: Payer: Self-pay | Admitting: *Deleted

## 2015-08-12 MED ORDER — ERGOCALCIFEROL 1.25 MG (50000 UT) PO CAPS: 50000.0000 [IU] | ORAL_CAPSULE | ORAL | Status: DC

## 2016-03-24 ENCOUNTER — Other Ambulatory Visit (INDEPENDENT_AMBULATORY_CARE_PROVIDER_SITE_OTHER): Payer: BLUE CROSS/BLUE SHIELD

## 2016-03-24 DIAGNOSIS — Z23 Encounter for immunization: Secondary | ICD-10-CM

## 2016-07-24 ENCOUNTER — Encounter: Payer: Self-pay | Admitting: *Deleted

## 2016-08-06 NOTE — Progress Notes (Deleted)
Scott Arroyo is a 31 y.o. male who presents for a complete physical.  He has the following concerns:  Vitamin D deficiency:  Level was 25 last year.  He was treated with rx x 12 weeks, and is currently taking   Trying for pregnancy (wife seeing fertility specialist) H/o knee pain  Immunization History  Administered Date(s) Administered  . Influenza,inj,Quad PF,36+ Mos 01/19/2014, 03/24/2016  . Tdap 08/16/2011   Last colonoscopy: never, n/a  Last PSA: n/a  Dentist: once a year Ophtho: every other year; wears glasses with driving Exercise: 3-4 times/week, weights and treadmill for 10 mins at end of weights. Plays basketball 2x/week. Lipids: Lab Results  Component Value Date   CHOL 162 08/17/2011   HDL 50 08/17/2011   LDLCALC 104 (H) 08/17/2011   TRIG 38 08/17/2011   CHOLHDL 3.2 08/17/2011     ROS: The patient denies anorexia, fever, weight changes, headaches, vision loss, decreased hearing, ear pain, hoarseness, chest pain, palpitations, dizziness, syncope, dyspnea on exertion, cough, swelling, nausea, vomiting, diarrhea, constipation, abdominal pain, melena, hematochezia, indigestion/heartburn, hematuria, incontinence, erectile dysfunction, nocturia, weakened urine stream, dysuria, genital lesions, numbness, tingling, weakness, tremor, suspicious skin lesions, depression, anxiety, abnormal bleeding/bruising, or enlarged lymph nodes. Sometimes he notes irregular heartbeat, where it periodically speeds up--going on for many years, unchanged. Occurs at rest, notices some extra beats. Short-lived, no associated SOB or chest pain. Occurs about 1x/week, and hasn't changed in frequency/severity in many years. Wife tells him that he snores, doesn't mention apnea. He feels somewhat unrefreshed in the morning, but admits to getting only 5-6 hours of sleep each night. Denies daytime somnolence or morning headaches. +left knee pain as per HPI UPDATE +acne on face and back (saw  dermatologist; face is better, still issues with back, but admits to noncompliance with the doxy).    PHYSICAL EXAM:  171# 9.6 oz last year  General Appearance:  Alert, cooperative, no distress, appears stated age   Head:  Normocephalic, without obvious abnormality, atraumatic   Eyes:  PERRL, conjunctiva/corneas clear, EOM's intact, fundi benign   Ears:  Normal TM's and external ear canals   Nose:  Nares normal, mucosa normal, no drainage or sinus tenderness   Throat:  Lips, mucosa, and tongue normal; teeth and gums normal   Neck:  Supple, no lymphadenopathy; thyroid: no enlargement/tenderness/nodules; no carotid bruit or JVD   Back:  Spine nontender, no curvature, ROM normal, no CVA tenderness   Lungs:  Clear to auscultation bilaterally without wheezes, rales or ronchi; respirations unlabored   Chest Wall:  No tenderness or deformity  Heart:  Regular rate and rhythm, S1 and S2 normal, no murmur, rub or gallop   Breast Exam:  No chest wall tenderness, masses or gynecomastia   Abdomen:  Soft, non-tender, nondistended, normoactive bowel sounds, no masses, no hepatosplenomegaly   Genitalia:  Normal male external genitalia without lesions. Testicles without masses. No inguinal hernias.   Rectal:  Deferred due to age <40 and lack of symptoms   Extremities:  No clubbing, cyanosis or edema  Pulses:  2+ and symmetric all extremities   Skin:  Skin color, texture, turgor normal, no rashes or lesions. Mild scarring/hyperpigmentation on forehead, no active acne lesions.  Acne scars (mild) on back, rare pustule. +tattoos  Lymph nodes:  Cervical, supraclavicular, and axillary nodes normal   Neurologic:  CNII-XII intact, normal strength, sensation and gait; reflexes 2+ and symmetric throughout     Psych: Normal mood, affect, hygiene and grooming.   ASSESSMENT/PLAN:  Recommended at least 30 minutes of aerobic activity at least 5  days/week; weight-bearing exercise at least 2-3x/wk; proper sunscreen use reviewed; healthy diet and alcohol recommendations (less than or equal to 2 drinks/day) reviewed; regular seatbelt use; changing batteries in smoke detectors. Self-testicular exams. Immunization recommendations discussed--continue yearly flu shots.  Colonoscopy recommendations reviewed--age 35

## 2016-08-07 ENCOUNTER — Encounter: Payer: BLUE CROSS/BLUE SHIELD | Admitting: Family Medicine

## 2017-03-02 ENCOUNTER — Other Ambulatory Visit: Payer: BLUE CROSS/BLUE SHIELD

## 2017-03-05 ENCOUNTER — Other Ambulatory Visit (INDEPENDENT_AMBULATORY_CARE_PROVIDER_SITE_OTHER): Payer: BLUE CROSS/BLUE SHIELD

## 2017-03-05 DIAGNOSIS — Z23 Encounter for immunization: Secondary | ICD-10-CM

## 2017-08-08 ENCOUNTER — Encounter (HOSPITAL_COMMUNITY): Payer: Self-pay

## 2017-08-08 ENCOUNTER — Emergency Department (HOSPITAL_COMMUNITY)
Admission: EM | Admit: 2017-08-08 | Discharge: 2017-08-08 | Disposition: A | Discharge status: Home / Self Care | Payer: BLUE CROSS/BLUE SHIELD | Attending: Emergency Medicine | Admitting: Emergency Medicine

## 2017-08-08 ENCOUNTER — Emergency Department (HOSPITAL_COMMUNITY): Payer: BLUE CROSS/BLUE SHIELD

## 2017-08-08 DIAGNOSIS — M25562 Pain in left knee: Secondary | ICD-10-CM

## 2017-08-08 DIAGNOSIS — F17290 Nicotine dependence, other tobacco product, uncomplicated: Secondary | ICD-10-CM | POA: Insufficient documentation

## 2017-08-08 DIAGNOSIS — Y9389 Activity, other specified: Secondary | ICD-10-CM | POA: Insufficient documentation

## 2017-08-08 DIAGNOSIS — M7918 Myalgia, other site: Secondary | ICD-10-CM | POA: Diagnosis not present

## 2017-08-08 DIAGNOSIS — Y92410 Unspecified street and highway as the place of occurrence of the external cause: Secondary | ICD-10-CM | POA: Diagnosis not present

## 2017-08-08 DIAGNOSIS — Y999 Unspecified external cause status: Secondary | ICD-10-CM | POA: Diagnosis not present

## 2017-08-08 MED ORDER — NAPROXEN 500 MG PO TABS: 500.0000 mg | ORAL_TABLET | Freq: Two times a day (BID) | ORAL | 0 refills | Status: DC

## 2017-08-08 MED ORDER — CYCLOBENZAPRINE HCL 5 MG PO TABS: 5.0000 mg | ORAL_TABLET | Freq: Every evening | ORAL | 0 refills | Status: DC | PRN

## 2017-08-08 NOTE — Discharge Instructions (Signed)
Take naproxen 2 times a day with meals.  Do not take other anti-inflammatories at the same time open (Advil, Motrin, ibuprofen, Aleve). You may supplement with Tylenol if you need further pain control. °Use Flexeril as needed for muscle stiffness or soreness.  Have caution, this may make you tired or groggy.  Do not drive or operate heavy machinery while taking this medicine. °Use ice packs or heating pads if this helps control your pain. °You will likely have continued muscle stiffness and soreness over the next couple days.  Follow-up with primary care in 1 week if your symptoms are not improving. °Return to the emergency room if you develop vision changes, vomiting, slurred speech, numbness, loss of bowel or bladder control, or any new or worsening symptoms. ° °

## 2017-08-08 NOTE — ED Notes (Signed)
Declined W/C at D/C and was escorted to lobby by RN. 
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
## 2018-04-05 ENCOUNTER — Telehealth: Payer: Self-pay

## 2018-04-05 NOTE — Telephone Encounter (Signed)
Patient has been scheduled

## 2018-04-05 NOTE — Telephone Encounter (Signed)
Patient is need of a physical appointment before the end of the year per his gf/wife. Are you willing to double book an appointment for one of your afternoons? Please advise.

## 2018-04-05 NOTE — Telephone Encounter (Signed)
Offer 11/21 at 1:45

## 2018-04-17 NOTE — Patient Instructions (Addendum)
HEALTH MAINTENANCE RECOMMENDATIONS:  It is recommended that you get at least 30 minutes of aerobic exercise at least 5 days/week (for weight loss, you may need as much as 60-90 minutes). This can be any activity that gets your heart rate up. This can be divided in 10-15 minute intervals if needed, but try and build up your endurance at least once a week.  Weight bearing exercise is also recommended twice weekly.  Eat a healthy diet with lots of vegetables, fruits and fiber.  "Colorful" foods have a lot of vitamins (ie green vegetables, tomatoes, red peppers, etc).  Limit sweet tea, regular sodas and alcoholic beverages, all of which has a lot of calories and sugar.  Up to 2 alcoholic drinks daily may be beneficial for men (unless trying to lose weight, watch sugars).  Drink a lot of water.  Sunscreen of at least SPF 30 should be used on all sun-exposed parts of the skin when outside between the hours of 10 am and 4 pm (not just when at beach or pool, but even with exercise, golf, tennis, and yard work!)  Use a sunscreen that says "broad spectrum" so it covers both UVA and UVB rays, and make sure to reapply every 1-2 hours.  Remember to change the batteries in your smoke detectors when changing your clock times in the spring and fall.  Use your seat belt every time you are in a car, and please drive safely and not be distracted with cell phones and texting while driving.  Your right arm pain is related to your weight lifting.  Stop doing the wrist exercises, and it should improve.  We discussed the risks of vaping today--PLEASE STOP.   What You Need to Know About Electronic Cigarettes Electronic cigarettes, or e-cigarettes, are battery-operated devices that deliver nicotine to your body. They come in many shapes, including in the shape of a cigarette, pipe, pen, and even a USB memory stick. E-cigarettes have a cartridge that contains a liquid form of nicotine. When you use the device, the liquid  heats up. It then becomes a vapor. Inhaling this vapor is called vaping. While e-cigarettes do not contain tar and the same cancer-causing chemicals that are in tobacco cigarettes, they may contain other harmful and cancer-causing chemicals, such as formaldehyde or acetaldehyde. Nicotine is thought to increase your risk for certain types of cancer. Many e-cigarettes have chemical colorings and flavorings. It is not clear how much nicotine you get when vaping. The health effects of vaping are not completely known. Some people may use e-cigarettes in order to quit smoking tobacco. However, this has not been proven to work, and the Education officer, environmentalood and Drug Administration (FDA) has not approved e-cigarettes for this purpose. How can using electronic cigarettes affect me?  E-cigarettes contain nicotine, which is a very addictive drug. Vaping may make you crave nicotine. Nicotine: ? Changes your blood sugar levels. ? Increases your heart rate, blood pressure, and breathing rate. ? Increases your risk of developing blood clots (hypercoaguable state) and diabetes.  If you smoke e-cigarettes, you may be more likely to start smoking or to smoke more tobacco cigarettes.  Becoming addicted to nicotine may make your brain more sensitive to other addictive drugs. You may move to other addictive substances.  If you are pregnant, the nicotine in e-cigarettes may be harmful to your baby. Nicotine can cause: ? Brain or lung problems for your baby. ? Your baby to be born too early. ? Your baby to be born with  a low birth weight.  If you are a child or a teen, vaping may affect your memory or lower your attention span.  You may be in danger of overdosing on nicotine. Nicotine poisoning can cause nausea, vomiting, seizures, and trouble breathing. What are the benefits of stopping vaping? If you stop vaping, you can avoid:  Getting addicted to nicotine.  Having nicotine side effects.  Getting nicotine  poisoning.  Being exposed to dangerous chemicals.  Increasing your risk of health problems.  Increasing your baby's risk of health problems, if you are pregnant.  Being more likely to use other addictive substances.  What steps can I take to stop vaping? If you can stop vaping on your own, do it before you become addicted to nicotine. If you need help stopping, ask your health care provider. There are three effective ways to fight nicotine addiction:  Nicotine replacement therapy. Using nicotine gum or a nicotine patch blocks your craving for nicotine. Over time, you can reduce the amount of nicotine you use until you can stop using nicotine completely without having cravings.  Prescription medicines approved to fight nicotine addiction. These stop nicotine cravings or block the effects of nicotine.  Behavioral therapy. This may include: ? A self-help smoking cessation program. ? Individual or group therapy. ? A smoking cessation support group.  Where can I get support? You can get support at these sites:  U.[employer]. [employer] of [employer]: WealthAccelerators.nl  U.S. Department of Health and Human Services: https://smokefree.gov  American Lung Association: ModelSolar.es  Where can I get more information? Learn more about e-cigarettes from:  U.[place]. [employer] of Health: http://www.pena.net/  U.S. Department of Health and Human Services: PrankTips.hu  Summary  E-cigarettes can cause nicotine addiction.  E-cigarettes are not approved as a way to stop smoking.  E-cigarettes are not a risk-free alternative to smoking tobacco.  There are ways to fight nicotine addiction.  Talk to your health care provider if you are unable to stop vaping on your own. This information is  not intended to replace advice given to you by your health care provider. Make sure you discuss any questions you have with your health care provider. Document Released: 09/06/2015 Document Revised: 02/02/2016 Document Reviewed: 05/07/2015 Elsevier Interactive Patient Education  [employer].
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
## 2018-04-18 ENCOUNTER — Ambulatory Visit (INDEPENDENT_AMBULATORY_CARE_PROVIDER_SITE_OTHER): Payer: BLUE CROSS/BLUE SHIELD | Admitting: Family Medicine

## 2018-04-18 ENCOUNTER — Encounter: Payer: Self-pay | Admitting: Family Medicine

## 2018-04-18 VITALS — BP 120/74 | HR 68 | Ht 73.0 in | Wt 190.2 lb

## 2018-04-18 DIAGNOSIS — R5383 Other fatigue: Secondary | ICD-10-CM | POA: Diagnosis not present

## 2018-04-18 DIAGNOSIS — E559 Vitamin D deficiency, unspecified: Secondary | ICD-10-CM | POA: Diagnosis not present

## 2018-04-18 DIAGNOSIS — M79631 Pain in right forearm: Secondary | ICD-10-CM

## 2018-04-18 DIAGNOSIS — Z0000 Encounter for general adult medical examination without abnormal findings: Secondary | ICD-10-CM | POA: Diagnosis not present

## 2018-04-18 DIAGNOSIS — Z23 Encounter for immunization: Secondary | ICD-10-CM

## 2018-04-18 LAB — POCT URINALYSIS DIP (PROADVANTAGE DEVICE)
BILIRUBIN UA: NEGATIVE mg/dL
Bilirubin, UA: NEGATIVE
Blood, UA: NEGATIVE
Glucose, UA: NEGATIVE mg/dL
Leukocytes, UA: NEGATIVE
Nitrite, UA: NEGATIVE
PH UA: 6 (ref 5.0–8.0)
Protein Ur, POC: NEGATIVE mg/dL
SPECIFIC GRAVITY, URINE: 1.03

## 2018-04-19 ENCOUNTER — Other Ambulatory Visit: Payer: BLUE CROSS/BLUE SHIELD

## 2018-04-19 DIAGNOSIS — Z0000 Encounter for general adult medical examination without abnormal findings: Secondary | ICD-10-CM

## 2018-04-19 DIAGNOSIS — R5383 Other fatigue: Secondary | ICD-10-CM

## 2018-04-19 DIAGNOSIS — E559 Vitamin D deficiency, unspecified: Secondary | ICD-10-CM

## 2018-04-20 LAB — TSH: TSH: 0.832 u[IU]/mL (ref 0.450–4.500)

## 2018-04-20 LAB — CBC WITH DIFFERENTIAL/PLATELET
Basophils Absolute: 0 10*3/uL (ref 0.0–0.2)
Basos: 0 %
EOS (ABSOLUTE): 0.1 10*3/uL (ref 0.0–0.4)
EOS: 2 %
HEMATOCRIT: 41 % (ref 37.5–51.0)
HEMOGLOBIN: 13.2 g/dL (ref 13.0–17.7)
IMMATURE GRANULOCYTES: 0 %
Immature Grans (Abs): 0 10*3/uL (ref 0.0–0.1)
Lymphocytes Absolute: 1 10*3/uL (ref 0.7–3.1)
Lymphs: 19 %
MCH: 26.2 pg — ABNORMAL LOW (ref 26.6–33.0)
MCHC: 32.2 g/dL (ref 31.5–35.7)
MCV: 82 fL (ref 79–97)
MONOCYTES: 12 %
Monocytes Absolute: 0.6 10*3/uL (ref 0.1–0.9)
NEUTROS PCT: 67 %
Neutrophils Absolute: 3.6 10*3/uL (ref 1.4–7.0)
Platelets: 270 10*3/uL (ref 150–450)
RBC: 5.03 x10E6/uL (ref 4.14–5.80)
RDW: 12.8 % (ref 12.3–15.4)
WBC: 5.3 10*3/uL (ref 3.4–10.8)

## 2018-04-20 LAB — COMPREHENSIVE METABOLIC PANEL
ALBUMIN: 4.3 g/dL (ref 3.5–5.5)
ALT: 25 IU/L (ref 0–44)
AST: 25 IU/L (ref 0–40)
Albumin/Globulin Ratio: 1.7 (ref 1.2–2.2)
Alkaline Phosphatase: 49 IU/L (ref 39–117)
BUN / CREAT RATIO: 16 (ref 9–20)
BUN: 17 mg/dL (ref 6–20)
Bilirubin Total: 0.9 mg/dL (ref 0.0–1.2)
CALCIUM: 9.3 mg/dL (ref 8.7–10.2)
CHLORIDE: 101 mmol/L (ref 96–106)
CO2: 24 mmol/L (ref 20–29)
CREATININE: 1.08 mg/dL (ref 0.76–1.27)
GFR, EST AFRICAN AMERICAN: 103 mL/min/{1.73_m2} (ref >59)
GFR, EST NON AFRICAN AMERICAN: 89 mL/min/{1.73_m2} (ref >59)
GLOBULIN, TOTAL: 2.6 g/dL (ref 1.5–4.5)
GLUCOSE: 84 mg/dL (ref 65–99)
Potassium: 4.3 mmol/L (ref 3.5–5.2)
Sodium: 136 mmol/L (ref 134–144)
Total Protein: 6.9 g/dL (ref 6.0–8.5)

## 2018-04-20 LAB — VITAMIN D 25 HYDROXY (VIT D DEFICIENCY, FRACTURES): Vit D, 25-Hydroxy: 21.3 ng/mL — ABNORMAL LOW (ref 30.0–100.0)

## 2018-04-20 LAB — LIPID PANEL
CHOLESTEROL TOTAL: 183 mg/dL (ref 100–199)
Chol/HDL Ratio: 3.1 ratio (ref 0.0–5.0)
HDL: 59 mg/dL (ref >39)
LDL Calculated: 117 mg/dL — ABNORMAL HIGH (ref 0–99)
TRIGLYCERIDES: 35 mg/dL (ref 0–149)
VLDL Cholesterol Cal: 7 mg/dL (ref 5–40)

## 2018-04-20 LAB — RPR: RPR: NONREACTIVE

## 2018-04-20 LAB — HIV ANTIBODY (ROUTINE TESTING W REFLEX): HIV Screen 4th Generation wRfx: NONREACTIVE

## 2018-04-23 ENCOUNTER — Other Ambulatory Visit: Payer: Self-pay | Admitting: Family Medicine

## 2018-04-23 MED ORDER — VITAMIN D (ERGOCALCIFEROL) 1.25 MG (50000 UNIT) PO CAPS: 50000.0000 [IU] | ORAL_CAPSULE | ORAL | 0 refills | Status: AC
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
## 2019-04-20 NOTE — Progress Notes (Deleted)
Scott Arroyo is a 33 y.o. male who presents for a complete physical.  He has the following concerns:  Vitamin D deficiency: Level was 21.3 in 03/2018, 25 in 07/2015. He was again treated with rx x 12 weeks, and advised to take a vitamin containing 1000 IU.  At his last check, he was very inconsistent with taking MVI.  He is currently taking   Immunization History  Administered Date(s) Administered  . Influenza,inj,Quad PF,6+ Mos 01/19/2014, 03/24/2016, 03/05/2017, 04/18/2018  . Tdap 08/16/2011   Last colonoscopy: never, n/a  Last PSA: n/a  Dentist: once a year Ophtho: every other year, went recently; wears glasses Exercise: 3-4 times/week, weights and treadmill for 10 mins at end of weights. Plays basketball 2x/week. Lipids: Lab Results  Component Value Date   CHOL 183 04/19/2018   HDL 59 04/19/2018   LDLCALC 117 (H) 04/19/2018   TRIG 35 04/19/2018   CHOLHDL 3.1 04/19/2018    ROS: The patient denies anorexia, fever, weight changes, headaches, vision loss, decreased hearing, ear pain, hoarseness, chest pain, palpitations, dizziness, syncope, dyspnea on exertion, cough, swelling, nausea, vomiting, diarrhea, constipation, abdominal pain, melena, hematochezia, indigestion/heartburn, hematuria, incontinence, erectile dysfunction, nocturia, weakened urine stream, dysuria, genital lesions, numbness, tingling, weakness, tremor, suspicious skin lesions, depression, anxiety, abnormal bleeding/bruising, or enlarged lymph nodes. Sometimes he notes irregular heartbeat, where it periodically speeds up--going on for many years, unchanged. Occurs at rest, notices some extra beats. Short-lived, no associated SOB or chest pain. Continues unchanged, probably less than once a week. +snoring (per wife, who denies apnea). He only sleeps 5-6 hours, sometimes only 4, so will feel tired due to lack of sleep.   Some dandruff, acne, and dry skin. Hasn't seen dermatologist in a few years, no longer takes any  medications. Left knee pops, but not painful (just makes noise).   PHYSICAL EXAM:  Wt Readings from Last 3 Encounters:  04/18/18 190 lb 3.2 oz (86.3 kg)  08/08/17 187 lb (84.8 kg)  08/05/15 171 lb 9.6 oz (77.8 kg)   General Appearance:  Alert, cooperative, no distress, appears stated age   Head:  Normocephalic, without obvious abnormality, atraumatic   Eyes:  PERRL, conjunctiva/corneas clear, EOM's intact, fundi benign   Ears:  Normal TM's and external ear canals   Nose:  Not examined, wearing mask due to COVID-19 pandemic  Throat:  Not examined, wearing mask due to COVID-19 pandemic  Neck:  Supple, no lymphadenopathy; thyroid: no enlargement/ tenderness/nodules; no carotid bruit or JVD   Back:  Spine nontender, no curvature, ROM normal, no CVA tenderness   Lungs:  Clear to auscultation bilaterally without wheezes, rales or ronchi; respirations unlabored   Chest Wall:  No tenderness or deformity  Heart:  Regular rate and rhythm, S1 and S2 normal, no murmur, rub or gallop   Breast Exam:  No chest wall tenderness, masses or gynecomastia   Abdomen:  Soft, non-tender, nondistended, normoactive bowel sounds, no masses, no hepatosplenomegaly   Genitalia:  Normal male external genitalia without lesions. Testicles without masses. No inguinal hernias.   Rectal:  Deferred due to age <40 and lack of symptoms   Extremities:  No clubbing, cyanosis or edema. Small corn right lateral foot, tender  Pulses:  2+ and symmetric all extremities   Skin:  Skin color, texture, turgor normal, no rashes or lesions. mild acne with slight scarring/hyperpigmentation on right forehead. Hypertrophic scarring/small keloids noted on chest (areas of ingrown hairs suspected, healed). Mild hyperpigmentation/scarring on upper back, no active acne/pustules there.  Lymph nodes:  Cervical, supraclavicular, and axillary nodes normal   Neurologic:  CNII-XII intact, normal strength, sensation  and gait; reflexes 2+ and symmetric throughout     Psych: Normal mood, affect, hygiene and grooming.  Corn on R lat foot??? UPDATE UPDATE SKIN  ASSESSMENT/PLAN:  Flu shot Vit D (other labs optional)  Recommended at least 30 minutes of aerobic activity at least 5 days/week; weight-bearing exercise at least 2-3x/wk; proper sunscreen use reviewed; healthy diet and alcohol recommendations (less than or equal to 2 drinks/day) reviewed; regular seatbelt use; changing batteries in smoke detectors. Self-testicular exams. Immunization recommendations discussed--continue yearlyflu shots. Colonoscopy recommendations reviewed--age 43-45.  PSA age 89.  F/u 1 year, sooner prn.

## 2019-04-23 ENCOUNTER — Encounter: Payer: BLUE CROSS/BLUE SHIELD | Admitting: Family Medicine

## 2019-04-23 ENCOUNTER — Telehealth: Payer: Self-pay | Admitting: Family Medicine

## 2019-04-23 DIAGNOSIS — Z0000 Encounter for general adult medical examination without abnormal findings: Secondary | ICD-10-CM

## 2019-04-23 NOTE — Telephone Encounter (Signed)
Patient no showed his physical today.  Needs to have letter sent and be assessed fee.    Veronica left many messages trying to confirm and screen his appointment, he never returned call.  Can be rescheduled at next available and/or be put on cancellation list (but needs to pay fee)

## 2019-04-28 NOTE — Telephone Encounter (Signed)
shauna °

## 2019-04-29 ENCOUNTER — Encounter: Payer: Self-pay | Admitting: Family Medicine

## 2019-04-29 NOTE — Telephone Encounter (Signed)
Letter done please add fee
